# Patient Record
Sex: Male | Born: 1990 | Race: White | Hispanic: No | Marital: Single | Smoking: Current every day smoker
Health system: Southern US, Community
[De-identification: ages and names within clinical notes are randomized; demographics above are authoritative.]

---
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
## 2013-11-21 ENCOUNTER — Emergency Department: Payer: Self-pay | Admitting: Internal Medicine

## 2014-09-26 ENCOUNTER — Emergency Department
Admission: EM | Admit: 2014-09-26 | Discharge: 2014-09-26 | Disposition: A | Discharge status: Home / Self Care | Payer: PRIVATE HEALTH INSURANCE | Attending: Emergency Medicine | Admitting: Emergency Medicine

## 2014-09-26 ENCOUNTER — Encounter: Payer: Self-pay | Admitting: Emergency Medicine

## 2014-09-26 DIAGNOSIS — Z720 Tobacco use: Secondary | ICD-10-CM | POA: Diagnosis not present

## 2014-09-26 DIAGNOSIS — N200 Calculus of kidney: Secondary | ICD-10-CM | POA: Insufficient documentation

## 2014-09-26 DIAGNOSIS — R1031 Right lower quadrant pain: Secondary | ICD-10-CM | POA: Diagnosis present

## 2014-09-26 LAB — URINALYSIS COMPLETE WITH MICROSCOPIC (ARMC ONLY)
Bilirubin Urine: NEGATIVE
GLUCOSE, UA: NEGATIVE mg/dL
Ketones, ur: NEGATIVE mg/dL
Leukocytes, UA: NEGATIVE
NITRITE: NEGATIVE
PROTEIN: 30 mg/dL — AB
SPECIFIC GRAVITY, URINE: 1.023 (ref 1.005–1.030)
pH: 5 (ref 5.0–8.0)

## 2014-09-26 LAB — BASIC METABOLIC PANEL
Anion gap: 13 (ref 5–15)
BUN: 9 mg/dL (ref 6–20)
CALCIUM: 9.4 mg/dL (ref 8.9–10.3)
CO2: 22 mmol/L (ref 22–32)
Chloride: 105 mmol/L (ref 101–111)
Creatinine, Ser: 1.16 mg/dL (ref 0.61–1.24)
GFR calc non Af Amer: >60 mL/min (ref >60)
Glucose, Bld: 154 mg/dL — ABNORMAL HIGH (ref 65–99)
Potassium: 3.5 mmol/L (ref 3.5–5.1)
Sodium: 140 mmol/L (ref 135–145)

## 2014-09-26 LAB — CBC WITH DIFFERENTIAL/PLATELET
Basophils Absolute: 0.1 10*3/uL (ref 0–0.1)
Basophils Relative: 1 %
EOS ABS: 0.1 10*3/uL (ref 0–0.7)
Eosinophils Relative: 2 %
HCT: 46.6 % (ref 40.0–52.0)
HEMOGLOBIN: 15.2 g/dL (ref 13.0–18.0)
LYMPHS PCT: 41 %
Lymphs Abs: 3.5 10*3/uL (ref 1.0–3.6)
MCH: 28.1 pg (ref 26.0–34.0)
MCHC: 32.7 g/dL (ref 32.0–36.0)
MCV: 85.9 fL (ref 80.0–100.0)
MONO ABS: 0.6 10*3/uL (ref 0.2–1.0)
Monocytes Relative: 7 %
Neutro Abs: 4.2 10*3/uL (ref 1.4–6.5)
Neutrophils Relative %: 49 %
Platelets: 216 10*3/uL (ref 150–440)
RBC: 5.43 MIL/uL (ref 4.40–5.90)
RDW: 12.8 % (ref 11.5–14.5)
WBC: 8.5 10*3/uL (ref 3.8–10.6)

## 2014-09-26 MED ORDER — ONDANSETRON HCL 4 MG PO TABS: 4.0000 mg | ORAL_TABLET | Freq: Every day | ORAL | Status: AC | PRN

## 2014-09-26 MED ORDER — KETOROLAC TROMETHAMINE 30 MG/ML IJ SOLN
INTRAMUSCULAR | Status: AC
  Filled 2014-09-26: qty 1

## 2014-09-26 MED ORDER — TAMSULOSIN HCL 0.4 MG PO CAPS: 0.4000 mg | ORAL_CAPSULE | Freq: Every day | ORAL | Status: DC

## 2014-09-26 MED ORDER — ONDANSETRON HCL 4 MG/2ML IJ SOLN
4.0000 mg | Freq: Once | INTRAMUSCULAR | Status: AC
  Administered 2014-09-26: 4 mg via INTRAVENOUS
  Filled 2014-09-26: qty 2

## 2014-09-26 MED ORDER — OXYCODONE-ACETAMINOPHEN 5-325 MG PO TABS: 1.0000 | ORAL_TABLET | ORAL | Status: DC | PRN

## 2014-09-26 MED ORDER — HYDROMORPHONE HCL 1 MG/ML IJ SOLN
1.0000 mg | Freq: Once | INTRAMUSCULAR | Status: AC
  Administered 2014-09-26: 1 mg via INTRAVENOUS
  Filled 2014-09-26: qty 1

## 2014-09-26 MED ORDER — KETOROLAC TROMETHAMINE 30 MG/ML IJ SOLN
30.0000 mg | Freq: Once | INTRAMUSCULAR | Status: AC
  Administered 2014-09-26: 30 mg via INTRAVENOUS

## 2014-09-26 MED ORDER — MORPHINE SULFATE 4 MG/ML IJ SOLN
4.0000 mg | Freq: Once | INTRAMUSCULAR | Status: AC
  Administered 2014-09-26: 4 mg via INTRAVENOUS
  Filled 2014-09-26: qty 1

## 2014-09-26 NOTE — ED Notes (Signed)
Sudden onset of right flank pain with nausea and vomiting

## 2014-09-26 NOTE — ED Notes (Signed)
MD Quale at bedside. 

## 2014-09-26 NOTE — Discharge Instructions (Signed)
Please seek medical attention for any high fevers, chest pain, shortness of breath, change in behavior, persistent vomiting, bloody stool or any other new or concerning symptoms.  Kidney Stones Kidney stones (urolithiasis) are deposits that form inside your kidneys. The intense pain is caused by the stone moving through the urinary tract. When the stone moves, the ureter goes into spasm around the stone. The stone is usually passed in the urine.  CAUSES   A disorder that makes certain neck glands produce too much parathyroid hormone (primary hyperparathyroidism).  A buildup of uric acid crystals, similar to gout in your joints.  Narrowing (stricture) of the ureter.  A kidney obstruction present at birth (congenital obstruction).  Previous surgery on the kidney or ureters.  Numerous kidney infections. SYMPTOMS   Feeling sick to your stomach (nauseous).  Throwing up (vomiting).  Blood in the urine (hematuria).  Pain that usually spreads (radiates) to the groin.  Frequency or urgency of urination. DIAGNOSIS   Taking a history and physical exam.  Blood or urine tests.  CT scan.  Occasionally, an examination of the inside of the urinary bladder (cystoscopy) is performed. TREATMENT   Observation.  Increasing your fluid intake.  Extracorporeal shock wave lithotripsy--This is a noninvasive procedure that uses shock waves to break up kidney stones.  Surgery may be needed if you have severe pain or persistent obstruction. There are various surgical procedures. Most of the procedures are performed with the use of small instruments. Only small incisions are needed to accommodate these instruments, so recovery time is minimized. The size, location, and chemical composition are all important variables that will determine the proper choice of action for you. Talk to your health care provider to better understand your situation so that you will minimize the risk of injury to yourself  and your kidney.  HOME CARE INSTRUCTIONS   Drink enough water and fluids to keep your urine clear or pale yellow. This will help you to pass the stone or stone fragments.  Strain all urine through the provided strainer. Keep all particulate matter and stones for your health care provider to see. The stone causing the pain may be as small as a grain of salt. It is very important to use the strainer each and every time you pass your urine. The collection of your stone will allow your health care provider to analyze it and verify that a stone has actually passed. The stone analysis will often identify what you can do to reduce the incidence of recurrences.  Only take over-the-counter or prescription medicines for pain, discomfort, or fever as directed by your health care provider.  Make a follow-up appointment with your health care provider as directed.  Get follow-up X-rays if required. The absence of pain does not always mean that the stone has passed. It may have only stopped moving. If the urine remains completely obstructed, it can cause loss of kidney function or even complete destruction of the kidney. It is your responsibility to make sure X-rays and follow-ups are completed. Ultrasounds of the kidney can show blockages and the status of the kidney. Ultrasounds are not associated with any radiation and can be performed easily in a matter of minutes. SEEK MEDICAL CARE IF:  You experience pain that is progressive and unresponsive to any pain medicine you have been prescribed. SEEK IMMEDIATE MEDICAL CARE IF:   Pain cannot be controlled with the prescribed medicine.  You have a fever or shaking chills.  The severity or intensity of  pain increases over 18 hours and is not relieved by pain medicine.  You develop a new onset of abdominal pain.  You feel faint or pass out.  You are unable to urinate. MAKE SURE YOU:   Understand these instructions.  Will watch your condition.  Will get  help right away if you are not doing well or get worse. Document Released: 03/02/2005 Document Revised: 11/02/2012 Document Reviewed: 08/03/2012 [facility] Patient Information 2015 [place], [place]. This information is not intended to replace advice given to you by your health care provider. Make sure you discuss any questions you have with your health care provider.  Dietary Guidelines to Help Prevent Kidney Stones Your risk of kidney stones can be decreased by adjusting the foods you eat. The most important thing you can do is drink enough fluid. You should drink enough fluid to keep your urine clear or pale yellow. The following guidelines provide specific information for the type of kidney stone you have had. GUIDELINES ACCORDING TO TYPE OF KIDNEY STONE Calcium Oxalate Kidney Stones  Reduce the amount of salt you eat. Foods that have a lot of salt cause your body to release excess calcium into your urine. The excess calcium can combine with a substance called oxalate to form kidney stones.  Reduce the amount of animal protein you eat if the amount you eat is excessive. Animal protein causes your body to release excess calcium into your urine. Ask your dietitian how much protein from animal sources you should be eating.  Avoid foods that are high in oxalates. If you take vitamins, they should have less than 500 mg of vitamin C. Your body turns vitamin C into oxalates. You do not need to avoid fruits and vegetables high in vitamin C. Calcium Phosphate Kidney Stones  Reduce the amount of salt you eat to help prevent the release of excess calcium into your urine.  Reduce the amount of animal protein you eat if the amount you eat is excessive. Animal protein causes your body to release excess calcium into your urine. Ask your dietitian how much protein from animal sources you should be eating.  Get enough calcium from food or take a calcium supplement (ask your dietitian for recommendations). Food  sources of calcium that do not increase your risk of kidney stones include:  Broccoli.  Dairy products, such as cheese and yogurt.  Pudding. Uric Acid Kidney Stones  Do not have more than 6 oz of animal protein per day. FOOD SOURCES Animal Protein Sources  Meat (all types).  Poultry.  Eggs.  Fish, seafood. Foods High in [employer] seasonings.  Soy sauce.  Teriyaki sauce.  Cured and processed meats.  Salted crackers and snack foods.  Fast food.  Canned soups and most canned foods. Foods High in Oxalates  Grains:  Amaranth.  Barley.  Grits.  Wheat germ.  Bran.  Buckwheat flour.  All bran cereals.  Pretzels.  Whole wheat bread.  Vegetables:  Beans (wax).  Beets and beet greens.  Collard greens.  Eggplant.  Escarole.  Leeks.  Okra.  Parsley.  Rutabagas.  Spinach.  Swiss chard.  Tomato paste.  Fried potatoes.  Sweet potatoes.  Fruits:  Red currants.  Figs.  Kiwi.  Rhubarb.  Meat and Other Protein Sources:  Beans (dried).  Soy burgers and other soybean products.  Miso.  Nuts (peanuts, almonds, pecans, cashews, hazelnuts).  Nut butters.  Sesame seeds and tahini (paste made of sesame seeds).  Poppy seeds.  Beverages:  Chocolate  drink mixes.  Soy milk.  Instant iced tea.  Juices made from high-oxalate fruits or vegetables.  Other:  Carob.  Chocolate.  Fruitcake.  Marmalades. Document Released: 06/27/2010 Document Revised: 03/07/2013 Document Reviewed: 01/27/2013 [facility] Patient Information 2015 [place], [place]. This information is not intended to replace advice given to you by your health care provider. Make sure you discuss any questions you have with your health care provider.

## 2014-09-26 NOTE — ED Provider Notes (Signed)
[facility] Emergency Department Provider Note   ____________________________________________  Time seen: 0800  I have reviewed the triage vital signs and the nursing notes.   HISTORY  Chief Complaint Flank Pain   History limited by: Not Limited   HPI Daniel Watkins is a 24 y.o. male who presents to the emergency department with right flank pain. This started this morning. It was acute on onset. He describes the pain as sharp. It is located in the right lower quadrant and radiates up to the right flank. He has had some associated nausea and vomited one time. He denies any fevers or chest pain. Denies any change in his urination. Denies having symptoms like this in the past.     History reviewed. No pertinent past medical history.  There are no active problems to display for this patient.   History reviewed. No pertinent past surgical history.  No current outpatient prescriptions on file.  Allergies Review of patient's allergies indicates no known allergies.  No family history on file.  Social History History  Substance Use Topics  . Smoking status: Current Every Day Smoker  . Smokeless tobacco: Not on file  . Alcohol Use: No    Review of Systems  Constitutional: Negative for fever. Cardiovascular: Negative for chest pain. Respiratory: Negative for shortness of breath. Gastrointestinal: Positive for right flank pain Genitourinary: Negative for dysuria. Musculoskeletal: Negative for back pain. Skin: Negative for rash. Neurological: Negative for headaches, focal weakness or numbness.   10-point ROS otherwise negative.  ____________________________________________   PHYSICAL EXAM:  VITAL SIGNS: ED Triage Vitals  Enc Vitals Group     BP 09/26/14 0753 113/65 mmHg     Pulse Rate 09/26/14 0753 68     Resp 09/26/14 0753 22     Temp 09/26/14 0753 98 F (36.7 C)     Temp Source 09/26/14 0753 Oral     SpO2 09/26/14 0753 99 %      Weight 09/26/14 0753 155 lb (70.308 kg)     Height 09/26/14 0753 5\' 10"  (1.778 m)     Head Cir --      Peak Flow --      Pain Score 09/26/14 0753 10   Constitutional: Alert and oriented. Well appearing and in no distress. Eyes: Conjunctivae are normal. PERRL. Normal extraocular movements. ENT   Head: Normocephalic and atraumatic.   Nose: No congestion/rhinnorhea.   Mouth/Throat: Mucous membranes are moist.   Neck: No stridor. Hematological/Lymphatic/Immunilogical: No cervical lymphadenopathy. Cardiovascular: Normal rate, regular rhythm.  No murmurs, rubs, or gallops. Respiratory: Normal respiratory effort without tachypnea nor retractions. Breath sounds are clear and equal bilaterally. No wheezes/rales/rhonchi. Gastrointestinal: Soft and nontender. No distention. There is no CVA tenderness. Genitourinary: Deferred Musculoskeletal: Normal range of motion in all extremities. No joint effusions.  No lower extremity tenderness nor edema. Neurologic:  Normal speech and language. No gross focal neurologic deficits are appreciated. Speech is normal.  Skin:  Skin is warm, dry and intact. No rash noted. Psychiatric: Mood and affect are normal. Speech and behavior are normal. Patient exhibits appropriate insight and judgment.  ____________________________________________    LABS (pertinent positives/negatives)  Labs Reviewed  URINALYSIS COMPLETEWITH MICROSCOPIC ([facility] ONLY) - Abnormal; Notable for the following:    Color, Urine YELLOW (*)    APPearance CLEAR (*)    Hgb urine dipstick 2+ (*)    Protein, ur 30 (*)    Bacteria, UA FEW (*)    Squamous Epithelial / LPF 0-5 (*)  All other components within normal limits  BASIC METABOLIC PANEL - Abnormal; Notable for the following:    Glucose, Bld 154 (*)    All other components within normal limits  CBC WITH DIFFERENTIAL/PLATELET  CBC WITH DIFFERENTIAL/PLATELET      ____________________________________________   EKG  None  ____________________________________________    RADIOLOGY  Bedside US: Mild hydronephrosis of the right kidney ____________________________________________   PROCEDURES  Procedure(s) performed: None  Critical Care performed: No  ____________________________________________   INITIAL IMPRESSION / ASSESSMENT AND PLAN / ED COURSE  Pertinent labs & imaging results that were available during my care of the patient were reviewed by me and considered in my medical decision making (see chart for details).  Patient presented to the emergency department today with right flank pain. Workup was consistent with a kidney stone with multiple red blood cells in urine. No signs of infection at this point. Pain was controlled with narcotic IV medications. Bedside ultrasound did not show any significant hydronephrosis of the right kidney. Discussed with patient and family return precautions for kidney stone.  ____________________________________________   FINAL CLINICAL IMPRESSION(S) / ED DIAGNOSES  Final diagnoses:  Kidney stone     Phineas Semen, MD 09/26/14 1038

## 2014-09-26 NOTE — ED Notes (Signed)
Pt woke up this am with severe right flank into groin.  Pt also has N/V with pain.

## 2016-12-07 ENCOUNTER — Encounter: Payer: Self-pay | Admitting: *Deleted

## 2016-12-07 ENCOUNTER — Emergency Department: Payer: BLUE CROSS/BLUE SHIELD

## 2016-12-07 ENCOUNTER — Emergency Department
Admission: EM | Admit: 2016-12-07 | Discharge: 2016-12-07 | Disposition: A | Discharge status: Home / Self Care | Payer: BLUE CROSS/BLUE SHIELD | Attending: Emergency Medicine | Admitting: Emergency Medicine

## 2016-12-07 DIAGNOSIS — F17210 Nicotine dependence, cigarettes, uncomplicated: Secondary | ICD-10-CM | POA: Diagnosis not present

## 2016-12-07 DIAGNOSIS — S39012A Strain of muscle, fascia and tendon of lower back, initial encounter: Secondary | ICD-10-CM | POA: Diagnosis not present

## 2016-12-07 DIAGNOSIS — M25562 Pain in left knee: Secondary | ICD-10-CM | POA: Diagnosis not present

## 2016-12-07 DIAGNOSIS — Y939 Activity, unspecified: Secondary | ICD-10-CM | POA: Insufficient documentation

## 2016-12-07 DIAGNOSIS — M542 Cervicalgia: Secondary | ICD-10-CM | POA: Diagnosis present

## 2016-12-07 DIAGNOSIS — Y999 Unspecified external cause status: Secondary | ICD-10-CM | POA: Insufficient documentation

## 2016-12-07 DIAGNOSIS — S161XXA Strain of muscle, fascia and tendon at neck level, initial encounter: Secondary | ICD-10-CM | POA: Insufficient documentation

## 2016-12-07 DIAGNOSIS — S8002XA Contusion of left knee, initial encounter: Secondary | ICD-10-CM | POA: Diagnosis not present

## 2016-12-07 DIAGNOSIS — Y92411 Interstate highway as the place of occurrence of the external cause: Secondary | ICD-10-CM | POA: Diagnosis not present

## 2016-12-07 MED ORDER — METHOCARBAMOL 500 MG PO TABS
1000.0000 mg | ORAL_TABLET | Freq: Once | ORAL | Status: AC
  Administered 2016-12-07: 1000 mg via ORAL
  Filled 2016-12-07: qty 2

## 2016-12-07 MED ORDER — METHOCARBAMOL 500 MG PO TABS
500.0000 mg | ORAL_TABLET | Freq: Four times a day (QID) | ORAL | 0 refills | Status: DC
Start: 2016-12-07 — End: 2019-01-24

## 2016-12-07 MED ORDER — KETOROLAC TROMETHAMINE 30 MG/ML IJ SOLN
30.0000 mg | Freq: Once | INTRAMUSCULAR | Status: AC
  Administered 2016-12-07: 30 mg via INTRAMUSCULAR
  Filled 2016-12-07: qty 1

## 2016-12-07 MED ORDER — MELOXICAM 15 MG PO TABS: 15.0000 mg | ORAL_TABLET | Freq: Every day | ORAL | 0 refills | Status: DC

## 2016-12-07 MED ORDER — METHOCARBAMOL 750 MG PO TABS: 750.0000 mg | ORAL_TABLET | Freq: Four times a day (QID) | ORAL | 0 refills | Status: DC
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
## 2019-01-24 ENCOUNTER — Ambulatory Visit
Admission: EM | Admit: 2019-01-24 | Discharge: 2019-01-24 | Disposition: A | Discharge status: Other Acute Inpatient Hospital | Payer: PRIVATE HEALTH INSURANCE

## 2019-01-24 ENCOUNTER — Other Ambulatory Visit: Payer: Self-pay

## 2019-01-24 ENCOUNTER — Encounter: Payer: Self-pay | Admitting: *Deleted

## 2019-01-24 ENCOUNTER — Emergency Department
Admission: EM | Admit: 2019-01-24 | Discharge: 2019-01-24 | Disposition: A | Discharge status: Home / Self Care | Payer: Worker's Compensation | Attending: Student in an Organized Health Care Education/Training Program | Admitting: Student in an Organized Health Care Education/Training Program

## 2019-01-24 ENCOUNTER — Emergency Department: Payer: Worker's Compensation

## 2019-01-24 ENCOUNTER — Encounter: Payer: Self-pay | Admitting: Emergency Medicine

## 2019-01-24 DIAGNOSIS — Y999 Unspecified external cause status: Secondary | ICD-10-CM | POA: Insufficient documentation

## 2019-01-24 DIAGNOSIS — Y929 Unspecified place or not applicable: Secondary | ICD-10-CM | POA: Insufficient documentation

## 2019-01-24 DIAGNOSIS — F17210 Nicotine dependence, cigarettes, uncomplicated: Secondary | ICD-10-CM | POA: Diagnosis not present

## 2019-01-24 DIAGNOSIS — S67192A Crushing injury of right middle finger, initial encounter: Secondary | ICD-10-CM | POA: Diagnosis present

## 2019-01-24 DIAGNOSIS — Y939 Activity, unspecified: Secondary | ICD-10-CM | POA: Insufficient documentation

## 2019-01-24 DIAGNOSIS — S61312A Laceration without foreign body of right middle finger with damage to nail, initial encounter: Secondary | ICD-10-CM | POA: Insufficient documentation

## 2019-01-24 DIAGNOSIS — Z23 Encounter for immunization: Secondary | ICD-10-CM | POA: Diagnosis not present

## 2019-01-24 DIAGNOSIS — W231XXA Caught, crushed, jammed, or pinched between stationary objects, initial encounter: Secondary | ICD-10-CM | POA: Insufficient documentation

## 2019-01-24 DIAGNOSIS — S61319A Laceration without foreign body of unspecified finger with damage to nail, initial encounter: Secondary | ICD-10-CM

## 2019-01-24 MED ORDER — BUPIVACAINE HCL (PF) 0.5 % IJ SOLN
20.0000 mL | Freq: Once | INTRAMUSCULAR | Status: AC
  Administered 2019-01-24: 19:00:00 10 mL
  Filled 2019-01-24: qty 20

## 2019-01-24 MED ORDER — TETANUS-DIPHTH-ACELL PERTUSSIS 5-2.5-18.5 LF-MCG/0.5 IM SUSP
0.5000 mL | Freq: Once | INTRAMUSCULAR | Status: AC
  Administered 2019-01-24: 0.5 mL via INTRAMUSCULAR
  Filled 2019-01-24: qty 0.5

## 2019-01-24 MED ORDER — LIDOCAINE HCL (PF) 1 % IJ SOLN
5.0000 mL | Freq: Once | INTRAMUSCULAR | Status: AC
  Administered 2019-01-24: 19:00:00 5 mL
  Filled 2019-01-24: qty 5

## 2019-01-24 MED ORDER — HYDROCODONE-ACETAMINOPHEN 5-325 MG PO TABS: 1.0000 | ORAL_TABLET | Freq: Two times a day (BID) | ORAL | 0 refills | Status: AC | PRN

## 2019-01-24 NOTE — ED Triage Notes (Signed)
Pt sent from UC for XR of RT middle finger. PT smashed finger in metal today. Lac noted to nail bed, no deformity noted.

## 2019-01-24 NOTE — Discharge Instructions (Signed)
Keep the wound clean, dry and covered. Use mild soap & water to cleanse the area. Wear the splint as needed for protection. Follow-up with any local urgent care center for suture removal in 7-10 days.

## 2019-01-24 NOTE — ED Notes (Signed)
Pt reports crushed his right hand third digit and went to UC but they sent him to the ED. Pt did not receive his tetanus shot. Provider in room to assess pt.

## 2019-01-24 NOTE — ED Notes (Signed)
Patient declined discharge vital signs. 

## 2019-01-24 NOTE — Discharge Instructions (Addendum)
Go to the emergency department for evaluation of your finger.  You need an xray which we do not have available at this time.

## 2019-01-24 NOTE — ED Provider Notes (Signed)
Daniel Watkins    CSN: [id number] Arrival date & time: 01/24/19  1543      History   Chief Complaint Chief Complaint  Patient presents with  . Finger Injury    HPI Daniel Watkins is a 28 y.o. male.   Patient presents with an injury to his right middle finger which occurred today.  He caught his finger between a heavy steel plate and a roller.  He has a laceration on the tip of his finger including his nailbed.  He has controlled the bleeding with pressure.  The history is provided by the patient.    History reviewed. No pertinent past medical history.  There are no active problems to display for this patient.   History reviewed. No pertinent surgical history.     Home Medications    Prior to Admission medications   Medication Sig Start Date End Date Taking? Authorizing Provider  meloxicam (MOBIC) 15 MG tablet Take 1 tablet (15 mg total) by mouth daily. 12/07/16   Cuthriell, Delorise Royals, PA-C  methocarbamol (ROBAXIN) 500 MG tablet Take 1 tablet (500 mg total) by mouth 4 (four) times daily. 12/07/16   Cuthriell, Delorise Royals, PA-C  methocarbamol (ROBAXIN) 750 MG tablet Take 1 tablet (750 mg total) by mouth 4 (four) times daily. 12/07/16   Cuthriell, Delorise Royals, PA-C  oxyCODONE-acetaminophen (ROXICET) 5-325 MG per tablet Take 1 tablet by mouth every 4 (four) hours as needed for severe pain. 09/26/14   Phineas Semen, MD  tamsulosin (FLOMAX) 0.4 MG CAPS capsule Take 1 capsule (0.4 mg total) by mouth daily. 09/26/14   Phineas Semen, MD    Family History Family History  Problem Relation Age of Onset  . Healthy Mother   . Healthy Father     Social History Social History   Tobacco Use  . Smoking status: Current Every Day Smoker    Packs/day: 0.50    Types: Cigarettes  . Smokeless tobacco: Never Used  Substance Use Topics  . Alcohol use: No  . Drug use: Not on file     Allergies   Patient has no known allergies.   Review of Systems Review of Systems   Constitutional: Negative for chills and fever.  HENT: Negative for ear pain and sore throat.   Eyes: Negative for pain and visual disturbance.  Respiratory: Negative for cough and shortness of breath.   Cardiovascular: Negative for chest pain and palpitations.  Gastrointestinal: Negative for abdominal pain and vomiting.  Genitourinary: Negative for dysuria and hematuria.  Musculoskeletal: Negative for arthralgias and back pain.  Skin: Positive for wound. Negative for color change and rash.  Neurological: Negative for seizures and syncope.  All other systems reviewed and are negative.    Physical Exam Triage Vital Signs ED Triage Vitals  Enc Vitals Group     BP 01/24/19 1549 120/75     Pulse Rate 01/24/19 1549 78     Resp 01/24/19 1549 17     Temp 01/24/19 1549 98.4 F (36.9 C)     Temp Source 01/24/19 1549 Oral     SpO2 01/24/19 1549 96 %     Weight --      Height --      Head Circumference --      Peak Flow --      Pain Score 01/24/19 1544 8     Pain Loc --      Pain Edu? --      Excl. in GC? --  No data found.  Updated Vital Signs BP 120/75 (BP Location: Left Arm)   Pulse 78   Temp 98.4 F (36.9 C) (Oral)   Resp 17   SpO2 96%   Visual Acuity Right Eye Distance:   Left Eye Distance:   Bilateral Distance:    Right Eye Near:   Left Eye Near:    Bilateral Near:     Physical Exam Vitals signs and nursing note reviewed.  Constitutional:      General: He is not in acute distress.    Appearance: He is well-developed.  HENT:     Head: Normocephalic and atraumatic.  Eyes:     Conjunctiva/sclera: Conjunctivae normal.  Neck:     Musculoskeletal: Neck supple.  Cardiovascular:     Rate and Rhythm: Normal rate and regular rhythm.     Heart sounds: No murmur.  Pulmonary:     Effort: Pulmonary effort is normal. No respiratory distress.     Breath sounds: Normal breath sounds.  Abdominal:     Palpations: Abdomen is soft.     Tenderness: There is no abdominal  tenderness.  Skin:    General: Skin is warm and dry.     Comments: Right middle finger: laceration through nailbed. No active bleeding.    Neurological:     Mental Status: He is alert.      UC Treatments / Results  Labs (all labs ordered are listed, but only abnormal results are displayed) Labs Reviewed - No data to display  EKG   Radiology No results found.  Procedures Procedures (including critical care time)  Medications Ordered in UC Medications - No data to display  Initial Impression / Assessment and Plan / UC Course  I have reviewed the triage vital signs and the nursing notes.  Pertinent labs & imaging results that were available during my care of the patient were reviewed by me and considered in my medical decision making (see chart for details).   Right middle finger laceration with damage to the nailbed.  Patient needs x-ray which we do not have here at this time.  Instructed him to go to the emergency department for evaluation.      Final Clinical Impressions(s) / UC Diagnoses   Final diagnoses:  Laceration of right middle finger with damage to nail, foreign body presence unspecified, initial encounter     Discharge Instructions     Go to the emergency department for evaluation of your finger.  You need an xray which we do not have available at this time.        ED Prescriptions    None     PDMP not reviewed this encounter.   Sharion Balloon, NP 01/24/19 [phone]

## 2019-01-24 NOTE — ED Provider Notes (Signed)
[facility] Emergency Department Provider Note ____________________________________________  Time seen: 1730  I have reviewed the triage vital signs and the nursing notes.  HISTORY  Chief Complaint  Laceration and Finger Injury  HPI Daniel Watkins is a 28 y.o. male presents himself to the ED for evaluation of an accidental crush injury laceration to the  right index finger.  Patient describes his finger got caught between 2 metal plates.  He presents with a partial nail avulsion and laceration to the distal fingertip.  He denies any other injury at this time.  He is unclear of his current tetanus status.  History reviewed. No pertinent past medical history.  There are no active problems to display for this patient.  History reviewed. No pertinent surgical history.  Prior to Admission medications   Medication Sig Start Date End Date Taking? Authorizing Provider  HYDROcodone-acetaminophen (NORCO) 5-325 MG tablet Take 1 tablet by mouth 2 (two) times daily as needed for up to 2 days. 01/24/19 01/26/19  Natividad Schlosser, Charlesetta Ivory, PA-C    Allergies Patient has no known allergies.  Family History  Problem Relation Age of Onset  . Healthy Mother   . Healthy Father     Social History Social History   Tobacco Use  . Smoking status: Current Every Day Smoker    Packs/day: 0.50    Types: Cigarettes  . Smokeless tobacco: Never Used  Substance Use Topics  . Alcohol use: No  . Drug use: Not Currently    Review of Systems  Constitutional: Negative for fever. Cardiovascular: Negative for chest pain. Respiratory: Negative for shortness of breath. Gastrointestinal: Negative for abdominal pain, vomiting and diarrhea. Genitourinary: Negative for dysuria. Musculoskeletal: Negative for back pain.  Right index finger crush injury as above. Skin: Negative for rash. Neurological: Negative for headaches, focal weakness or  numbness. ____________________________________________  PHYSICAL EXAM:  VITAL SIGNS: ED Triage Vitals  Enc Vitals Group     BP 01/24/19 1638 132/75     Pulse Rate 01/24/19 1638 73     Resp 01/24/19 1638 16     Temp 01/24/19 1638 99 F (37.2 C)     Temp Source 01/24/19 1638 Oral     SpO2 01/24/19 1638 98 %     Weight --      Height --      Head Circumference --      Peak Flow --      Pain Score 01/24/19 1637 8     Pain Loc --      Pain Edu? --      Excl. in GC? --     Constitutional: Alert and oriented. Well appearing and in no distress. Head: Normocephalic and atraumatic. Eyes: Conjunctivae are normal. Normal extraocular movements Cardiovascular: Normal rate, regular rhythm. Normal distal pulses. Respiratory: Normal respiratory effort.  Musculoskeletal: Normal composite fist on the right.  Right index finger with partial nail bed injury and distal fingertip laceration noted productive bleeding is appreciated.  Normal flexion range of the right index finger is noted.  Normal composite fist. Normal  Nontender with normal range of motion in all extremities.  Neurologic:  Normal sensation. Normal speech and language. No gross focal neurologic deficits are appreciated. Skin:  Skin is warm, dry and intact. No rash noted. ____________________________________________   RADIOLOGY  DG Right Middle Finger  IMPRESSION: Probable nondisplaced fracture involving the tuft of the distal phalanx. No dislocation.  I, Lissa Hoard, personally viewed and evaluated these images (plain radiographs) as  part of my medical decision making, as well as reviewing the written report by the radiologist. ____________________________________________  PROCEDURES  Tdap 0.5 ml IM  .Marland KitchenLaceration Repair  Date/Time: 01/24/2019 6:05 PM Performed by: Melvenia Needles, PA-C Authorized by: Melvenia Needles, PA-C   Consent:    Consent given by:  Patient   Risks discussed:  Pain  and poor wound healing   Alternatives discussed:  No treatment Anesthesia (see MAR for exact dosages):    Anesthesia method:  Nerve block (transthecal block)   Block location:  Flexor tendon sheath   Block needle gauge:  27 G   Block anesthetic:  Lidocaine 1% w/o epi and bupivacaine 0.5% w/o epi   Block technique:  Transthecal    Block injection procedure:  Anatomic landmarks palpated and incremental injection   Block outcome:  Anesthesia achieved Laceration details:    Location:  Finger   Finger location:  R long finger   Length (cm):  1   Depth (mm):  3 Repair type:    Repair type:  Intermediate Pre-procedure details:    Preparation:  Patient was prepped and draped in usual sterile fashion Exploration:    Contaminated: no   Treatment:    Area cleansed with:  Betadine   Amount of cleaning:  Standard Skin repair:    Repair method:  Sutures   Suture size:  6-0   Suture material:  Nylon   Suture technique:  Simple interrupted   Number of sutures:  3 Approximation:    Approximation:  Close Post-procedure details:    Dressing:  Non-adherent dressing and splint for protection   Patient tolerance of procedure:  Tolerated well, no immediate complications .Nail Removal  Date/Time: 01/24/2019 6:07 PM Performed by: Melvenia Needles, PA-C Authorized by: Melvenia Needles, PA-C   Consent:    Consent given by:  Patient   Risks discussed:  Pain Location:    Hand:  R long finger Pre-procedure details:    Skin preparation:  Betadine   Preparation: Patient was prepped and draped in the usual sterile fashion   Anesthesia (see MAR for exact dosages):    Anesthesia method:  Nerve block   Block anesthetic:  Lidocaine 1% w/o epi and bupivacaine 0.5% w/o epi   Block technique:  Transthecal   Block injection procedure:  Anatomic landmarks palpated   Block outcome:  Anesthesia achieved Nail Removal:    Nail removed:  Partial   Nail side:  Radial   Nail bed repaired: yes      Nail bed repair material:  6-0 vicryl   Number of sutures:  2   Removed nail replaced and anchored: no   Post-procedure details:    Dressing:  Splint and petrolatum-impregnated gauze   Patient tolerance of procedure:  Tolerated well, no immediate complications  ____________________________________________  INITIAL IMPRESSION / ASSESSMENT AND PLAN / ED COURSE  Patient with ED evaluation of an accidental crush injury to the right middle finger. He sustained a non-displaced distal tuft fracture and a partial nail avulsion. The nail fragment is removed and the nailbed and dermal lacerations are repaired. Wound care instructions are provided. He will follow-up with a local urgent care for suture removal.   Devrin Monforte was evaluated in Emergency Department on 01/25/2019 for the symptoms described in the history of present illness. He was evaluated in the context of the global COVID-19 pandemic, which necessitated consideration that the patient might be at risk for infection with the SARS-CoV-2  virus that causes COVID-19. Institutional protocols and algorithms that pertain to the evaluation of patients at risk for COVID-19 are in a state of rapid change based on information released by regulatory bodies including the CDC and federal and state organizations. These policies and algorithms were followed during the patient's care in the ED. ____________________________________________  FINAL CLINICAL IMPRESSION(S) / ED DIAGNOSES  Final diagnoses:  Crushing injury of right middle finger, initial encounter  Laceration of finger nail bed, initial encounter      Lissa HoardMenshew, Letricia Krinsky V Bacon, PA-C 01/25/19 1938    Willy Eddyobinson, Patrick, MD 01/27/19 [phone]

## 2019-01-24 NOTE — ED Triage Notes (Signed)
Patient reports smashing right middle finger distal portion between a 500lb steal metal plate. Finger nail is split open with laceration into fingernail bed. Bleeding controlled. Patient cleaned well at work right after injury.

## 2019-10-18 ENCOUNTER — Other Ambulatory Visit: Payer: Self-pay

## 2019-10-18 ENCOUNTER — Ambulatory Visit (INDEPENDENT_AMBULATORY_CARE_PROVIDER_SITE_OTHER): Payer: Self-pay

## 2019-10-18 ENCOUNTER — Ambulatory Visit: Payer: Self-pay

## 2019-10-18 ENCOUNTER — Ambulatory Visit
Admission: EM | Admit: 2019-10-18 | Discharge: 2019-10-18 | Disposition: A | Discharge status: Home / Self Care | Payer: Self-pay | Attending: Internal Medicine | Admitting: Internal Medicine

## 2019-10-18 DIAGNOSIS — N200 Calculus of kidney: Secondary | ICD-10-CM | POA: Insufficient documentation

## 2019-10-18 DIAGNOSIS — F17210 Nicotine dependence, cigarettes, uncomplicated: Secondary | ICD-10-CM | POA: Insufficient documentation

## 2019-10-18 DIAGNOSIS — Z87442 Personal history of urinary calculi: Secondary | ICD-10-CM | POA: Insufficient documentation

## 2019-10-18 DIAGNOSIS — Y92410 Unspecified street and highway as the place of occurrence of the external cause: Secondary | ICD-10-CM | POA: Insufficient documentation

## 2019-10-18 DIAGNOSIS — S20212A Contusion of left front wall of thorax, initial encounter: Secondary | ICD-10-CM | POA: Insufficient documentation

## 2019-10-18 LAB — URINALYSIS, COMPLETE (UACMP) WITH MICROSCOPIC
Bilirubin Urine: NEGATIVE
Glucose, UA: NEGATIVE mg/dL
Ketones, ur: NEGATIVE mg/dL
Leukocytes,Ua: NEGATIVE
Nitrite: NEGATIVE
Protein, ur: NEGATIVE mg/dL
RBC / HPF: >50 RBC/hpf (ref 0–5)
Specific Gravity, Urine: 1.025 (ref 1.005–1.030)
pH: 6 (ref 5.0–8.0)

## 2019-10-18 MED ORDER — HYDROCODONE-ACETAMINOPHEN 5-325 MG PO TABS: 1.0000 | ORAL_TABLET | Freq: Four times a day (QID) | ORAL | 0 refills | Status: DC | PRN

## 2019-10-18 MED ORDER — TAMSULOSIN HCL 0.4 MG PO CAPS: 0.4000 mg | ORAL_CAPSULE | Freq: Every day | ORAL | 0 refills | Status: AC
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
## 2019-11-06 ENCOUNTER — Ambulatory Visit
Admission: RE | Admit: 2019-11-06 | Discharge: 2019-11-06 | Disposition: A | Discharge status: Home / Self Care | Payer: HRSA Program | Source: Ambulatory Visit | Attending: Internal Medicine | Admitting: Internal Medicine

## 2019-11-06 ENCOUNTER — Other Ambulatory Visit: Payer: Self-pay

## 2019-11-06 VITALS — Ht 70.0 in | Wt 155.0 lb

## 2019-11-06 DIAGNOSIS — J029 Acute pharyngitis, unspecified: Secondary | ICD-10-CM | POA: Insufficient documentation

## 2019-11-06 DIAGNOSIS — R5383 Other fatigue: Secondary | ICD-10-CM

## 2019-11-06 DIAGNOSIS — Z20822 Contact with and (suspected) exposure to covid-19: Secondary | ICD-10-CM | POA: Diagnosis not present

## 2019-11-06 DIAGNOSIS — B349 Viral infection, unspecified: Secondary | ICD-10-CM | POA: Insufficient documentation

## 2019-11-06 DIAGNOSIS — F17210 Nicotine dependence, cigarettes, uncomplicated: Secondary | ICD-10-CM | POA: Insufficient documentation

## 2019-11-06 DIAGNOSIS — R509 Fever, unspecified: Secondary | ICD-10-CM

## 2019-11-06 LAB — GROUP A STREP BY PCR: Group A Strep by PCR: NOT DETECTED

## 2019-11-06 MED ORDER — BENZONATATE 100 MG PO CAPS: 100.0000 mg | ORAL_CAPSULE | Freq: Three times a day (TID) | ORAL | 0 refills | Status: AC

## 2019-11-06 MED ORDER — CEPACOL SORE THROAT SPRAY 0.1-33 % MT LIQD: 1.0000 | OROMUCOSAL | 0 refills | Status: AC | PRN

## 2019-11-06 NOTE — Discharge Instructions (Addendum)
Warm salt water gargle Please quarantine until COVID-19 test result is available Return to urgent care if symptoms worsen.

## 2019-11-06 NOTE — ED Triage Notes (Signed)
Patient in today w/ c/o sore throat, fatigue, and fever since yesterday. Patient states fever broke around noon today but throat has worsened. Patient denies COVID exposure, loss of taste or smell, N/V/D.

## 2019-11-07 LAB — SARS CORONAVIRUS 2 (TAT 6-24 HRS): SARS Coronavirus 2: NEGATIVE

## 2019-11-08 NOTE — ED Provider Notes (Addendum)
[facility]    CSN: [id number] Arrival date & time: 11/06/19  1552      History   Chief Complaint Chief Complaint  Patient presents with  . Sore Throat  . Fatigue  . Fever    HPI Daniel Watkins is a 29 y.o. male comes to the urgent care with 1 day history of sore throat, fatigue, and subjective fever.  Patient is afebrile at this time.  He denies any shortness of breath, nausea, vomiting or diarrhea.  Very sick contacts.  No loss of taste or . patient denies any chest pain or chest pressure.   HPI  History reviewed. No pertinent past medical history.  There are no problems to display for this patient.   History reviewed. No pertinent surgical history.     Home Medications    Prior to Admission medications   Medication Sig Start Date End Date Taking? Authorizing Provider  benzonatate (TESSALON) 100 MG capsule Take 1 capsule (100 mg total) by mouth every 8 (eight) hours. 11/06/19   LampteyBritta Mccreedy, MD  Dyclonine-Glycerin (CEPACOL SORE THROAT SPRAY) 0.1-33 % LIQD Use as directed 1 spray in the mouth or throat as needed. 11/06/19   LampteyBritta Mccreedy, MD    Family History Family History  Problem Relation Age of Onset  . Healthy Mother   . Healthy Father     Social History Social History   Tobacco Use  . Smoking status: Current Every Day Smoker    Packs/day: 0.50    Types: Cigarettes  . Smokeless tobacco: Never Used  Vaping Use  . Vaping Use: Never used  Substance Use Topics  . Alcohol use: Yes    Comment: occasionally  . Drug use: Not Currently     Allergies   Patient has no known allergies.   Review of Systems Review of Systems  Constitutional: Positive for fever. Negative for activity change, chills and fatigue.  HENT: Negative.   Respiratory: Positive for shortness of breath.   Cardiovascular: Negative.   Gastrointestinal: Negative for diarrhea, nausea and vomiting.  Genitourinary: Negative.   Musculoskeletal: Negative.       Physical Exam Triage Vital Signs ED Triage Vitals [11/06/19 1615]  Enc Vitals Group     BP      Pulse      Resp      Temp      Temp src      SpO2      Weight 155 lb (70.3 kg)     Height 5\' 10"  (1.778 m)     Head Circumference      Peak Flow      Pain Score 7     Pain Loc      Pain Edu?      Excl. in GC?    No data found.  Updated Vital Signs Ht 5\' 10"  (1.778 m)   Wt 70.3 kg   BMI 22.24 kg/m   Visual Acuity Right Eye Distance:   Left Eye Distance:   Bilateral Distance:    Right Eye Near:   Left Eye Near:    Bilateral Near:     Physical Exam Vitals and nursing note reviewed.  Constitutional:      General: He is not in acute distress.    Appearance: He is well-developed. He is not ill-appearing.  HENT:     Right Ear: Tympanic membrane normal.     Left Ear: Tympanic membrane normal.     Nose: No congestion or rhinorrhea.  Mouth/Throat:     Mouth: Mucous membranes are moist. Mucous membranes are pale. Oral lesions present.     Pharynx: No posterior oropharyngeal erythema.     Tonsils: No tonsillar exudate.  Cardiovascular:     Rate and Rhythm: Normal rate and regular rhythm.  Abdominal:     General: Bowel sounds are normal. There is no distension.     Palpations: Abdomen is soft. There is no mass.  Lymphadenopathy:     Cervical: No cervical adenopathy.  Neurological:     Mental Status: He is alert.      UC Treatments / Results  Labs (all labs ordered are listed, but only abnormal results are displayed) Labs Reviewed  GROUP A STREP BY PCR  SARS CORONAVIRUS 2 (TAT 6-24 HRS)    EKG   Radiology No results found.  Procedures Procedures (including critical care time)  Medications Ordered in UC Medications - No data to display  Initial Impression / Assessment and Plan / UC Course  I have reviewed the triage vital signs and the nursing notes.  Pertinent labs & imaging results that were available during my care of the patient were  reviewed by me and considered in my medical decision making (see chart for details).     1.  Acute viral syndrome: Strep PCR is negative COVID-19 PCR test has been sent Warm salt water gargle Patient is advised to quarantine until COVID-19 test results are available Tessalon Perles as needed for cough Cepacol throat spray Return precautions given. Final Clinical Impressions(s) / UC Diagnoses   Final diagnoses:  Viral syndrome     Discharge Instructions     Warm salt water gargle Please quarantine until COVID-19 test result is available Return to urgent care if symptoms worsen.    ED Prescriptions    Medication Sig Dispense Auth. Provider   benzonatate (TESSALON) 100 MG capsule Take 1 capsule (100 mg total) by mouth every 8 (eight) hours. 21 capsule Mardi Cannady, Britta Mccreedy, MD   Dyclonine-Glycerin (CEPACOL SORE THROAT SPRAY) 0.1-33 % LIQD Use as directed 1 spray in the mouth or throat as needed. 118 mL Redell Bhandari, Britta Mccreedy, MD     PDMP not reviewed this encounter.   Merrilee Jansky, MD 11/08/19 1621    Merrilee Jansky, MD 11/08/19 1739
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
